# Patient Record
Sex: Male | Born: 1965 | Race: White | Hispanic: No
Health system: Western US, Academic
[De-identification: ages and names within clinical notes are randomized; demographics above are authoritative.]

---

## 2012-04-27 DIAGNOSIS — E039 Hypothyroidism, unspecified: Secondary | ICD-10-CM | POA: Insufficient documentation

## 2012-04-27 DIAGNOSIS — Z89519 Acquired absence of unspecified leg below knee: Secondary | ICD-10-CM | POA: Insufficient documentation

## 2012-07-08 DIAGNOSIS — R238 Other skin changes: Secondary | ICD-10-CM | POA: Insufficient documentation

## 2012-07-08 HISTORY — DX: Other skin changes: R23.8

## 2013-10-23 DIAGNOSIS — F1910 Other psychoactive substance abuse, uncomplicated: Secondary | ICD-10-CM | POA: Insufficient documentation

## 2013-10-23 DIAGNOSIS — Z91199 Patient's noncompliance with other medical treatment and regimen due to unspecified reason: Secondary | ICD-10-CM

## 2013-10-23 DIAGNOSIS — Z9119 Patient's noncompliance with other medical treatment and regimen: Secondary | ICD-10-CM

## 2013-10-23 HISTORY — DX: Other psychoactive substance abuse, uncomplicated: F19.10

## 2013-10-23 HISTORY — DX: Patient's noncompliance with other medical treatment and regimen due to unspecified reason: Z91.199

## 2013-10-23 HISTORY — DX: Patient's noncompliance with other medical treatment and regimen: Z91.19

## 2013-12-12 DIAGNOSIS — F29 Unspecified psychosis not due to a substance or known physiological condition: Secondary | ICD-10-CM | POA: Insufficient documentation

## 2013-12-12 HISTORY — DX: Unspecified psychosis not due to a substance or known physiological condition: F29

## 2017-08-08 DIAGNOSIS — M009 Pyogenic arthritis, unspecified: Secondary | ICD-10-CM | POA: Insufficient documentation

## 2018-11-25 DIAGNOSIS — M7989 Other specified soft tissue disorders: Secondary | ICD-10-CM | POA: Insufficient documentation

## 2021-02-23 ENCOUNTER — Other Ambulatory Visit: Payer: Self-pay

## 2021-02-23 ENCOUNTER — Emergency Department
Admission: EM | Admit: 2021-02-23 | Discharge: 2021-02-23 | Disposition: A | Discharge status: Home / Self Care | Payer: Medicare Other | Attending: Emergency Medicine | Admitting: Emergency Medicine

## 2021-02-23 ENCOUNTER — Encounter (HOSPITAL_COMMUNITY): Payer: Self-pay | Admitting: Unknown Physician Specialty

## 2021-02-23 ENCOUNTER — Emergency Department (EMERGENCY_DEPARTMENT_HOSPITAL): Payer: Medicare Other

## 2021-02-23 DIAGNOSIS — Z4789 Encounter for other orthopedic aftercare: Secondary | ICD-10-CM

## 2021-02-23 DIAGNOSIS — Z79899 Other long term (current) drug therapy: Secondary | ICD-10-CM | POA: Insufficient documentation

## 2021-02-23 DIAGNOSIS — W010XXA Fall on same level from slipping, tripping and stumbling without subsequent striking against object, initial encounter: Secondary | ICD-10-CM | POA: Insufficient documentation

## 2021-02-23 DIAGNOSIS — Y939 Activity, unspecified: Secondary | ICD-10-CM | POA: Insufficient documentation

## 2021-02-23 DIAGNOSIS — Z720 Tobacco use: Secondary | ICD-10-CM | POA: Insufficient documentation

## 2021-02-23 DIAGNOSIS — W1830XA Fall on same level, unspecified, initial encounter: Secondary | ICD-10-CM

## 2021-02-23 DIAGNOSIS — Z89512 Acquired absence of left leg below knee: Secondary | ICD-10-CM | POA: Insufficient documentation

## 2021-02-23 DIAGNOSIS — E039 Hypothyroidism, unspecified: Secondary | ICD-10-CM | POA: Insufficient documentation

## 2021-02-23 DIAGNOSIS — S8992XA Unspecified injury of left lower leg, initial encounter: Secondary | ICD-10-CM

## 2021-02-23 LAB — TRAUMA PANEL
Alcohol (Ethyl): NEGATIVE mg/dL
Anion Gap: 10 (ref 4–12)
Calcium: 9.6 mg/dL (ref 8.9–10.2)
Carbon Dioxide, Total: 26 meq/L (ref 22–32)
Chloride: 102 meq/L (ref 98–108)
Creatinine: 1.38 mg/dL — ABNORMAL HIGH (ref 0.51–1.18)
Glucose: 86 mg/dL (ref 62–125)
Hematocrit: 47 % (ref 38.0–50.0)
Hemoglobin: 15.9 g/dL (ref 13.0–18.0)
Lipase: 18 U/L (ref <70)
MCH: 31.5 pg (ref 27.3–33.6)
MCHC: 33.8 g/dL (ref 32.2–36.5)
MCV: 94 fL (ref 81–98)
Partial Thromboplastin Time: 38 s — ABNORMAL HIGH (ref 22–35)
Platelet Count: 198 10*3/uL (ref 150–400)
Potassium: 3.8 meq/L (ref 3.6–5.2)
Prothrombin INR: 1 (ref 0.8–1.3)
Prothrombin Time Patient: 12.6 s (ref 10.7–15.6)
RBC: 5.04 10*6/uL (ref 4.40–5.60)
RDW-CV: 14 % (ref 11.6–14.4)
Sodium: 138 meq/L (ref 135–145)
Urea Nitrogen: 14 mg/dL (ref 8–21)
WBC: 7.61 10*3/uL (ref 4.3–10.0)
eGFR by CKD-EPI: 58 mL/min/{1.73_m2} — ABNORMAL LOW (ref >59)

## 2021-02-23 LAB — 1ST EXTRA ORANGE TOP

## 2021-02-23 LAB — 1ST EXTRA PEARL TOP

## 2021-02-23 MED ORDER — HYDROMORPHONE HCL 1 MG/ML IJ SOLN
0.5000 mg | INTRAMUSCULAR | Status: DC | PRN
Start: 2021-02-23 — End: 2021-02-23
  Administered 2021-02-23: 0.5 mg via INTRAVENOUS
  Filled 2021-02-23: qty 1

## 2021-02-23 MED ORDER — ONDANSETRON HCL 4 MG/2ML IJ SOLN
4.0000 mg | Freq: Once | INTRAMUSCULAR | Status: AC
Start: 2021-02-23 — End: 2021-02-23
  Administered 2021-02-23: 4 mg via INTRAVENOUS
  Filled 2021-02-23: qty 2

## 2021-02-23 MED ORDER — CYCLOBENZAPRINE HCL 10 MG OR TABS
10.0000 mg | ORAL_TABLET | Freq: Once | ORAL | Status: AC
Start: 2021-02-23 — End: 2021-02-23
  Administered 2021-02-23: 10 mg via ORAL
  Filled 2021-02-23: qty 1
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
## 2021-02-23 NOTE — Discharge Instructions (Addendum)
You may use tylenol and ibuprofen for pain. Please follow up in the [facility] Aftercare clinic on 4/20 @ 230pm. This visit is for a ED follow up where you may discuss your fall and knee pain. If your pain is not getting better you may need to consider ligamentous injury which would require additional imaging which is performed outpatient. They will also help you establish with a PCP.
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
## 2021-02-23 NOTE — ED Notes (Signed)
Pt BIB EMS. Pt presents with fall on his left leg (previous/old amputation). Pt states he fell on his left amp. leg about couple hours ago prior making it to the ED. Pt c/o pain and it radiates superiorly. Pt c/o numbness on LLE (leg that pt fell) which is new. Pt denies hitting his head, denies LOC.     Lorelle Formosa, RN  02/23/21 [phone]
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
## 2021-03-03 ENCOUNTER — Other Ambulatory Visit (HOSPITAL_BASED_OUTPATIENT_CLINIC_OR_DEPARTMENT_OTHER): Payer: Self-pay

## 2021-03-03 ENCOUNTER — Other Ambulatory Visit: Payer: Self-pay

## 2021-03-03 ENCOUNTER — Emergency Department
Admission: EM | Admit: 2021-03-03 | Discharge: 2021-03-03 | Disposition: A | Discharge status: Home / Self Care | Payer: Medicare Other | Attending: Physician Assistant | Admitting: Physician Assistant

## 2021-03-03 DIAGNOSIS — X58XXXA Exposure to other specified factors, initial encounter: Secondary | ICD-10-CM | POA: Insufficient documentation

## 2021-03-03 DIAGNOSIS — L03116 Cellulitis of left lower limb: Secondary | ICD-10-CM

## 2021-03-03 DIAGNOSIS — T8740 Infection of amputation stump, unspecified extremity: Secondary | ICD-10-CM | POA: Insufficient documentation

## 2021-03-03 LAB — C_REACTIVE PROTEIN: C_Reactive Protein: 0.8 mg/L (ref 0.0–10.0)

## 2021-03-03 LAB — CBC, DIFF
% Basophils: 1 %
% Eosinophils: 3 %
% Immature Granulocytes: 0 %
% Lymphocytes: 26 %
% Monocytes: 7 %
% Neutrophils: 63 %
% Nucleated RBC: 0 %
Absolute Eosinophil Count: 0.25 10*3/uL (ref 0.00–0.50)
Absolute Lymphocyte Count: 1.98 10*3/uL (ref 1.00–4.80)
Basophils: 0.05 10*3/uL (ref 0.00–0.20)
Hematocrit: 46 % (ref 38.0–50.0)
Hemoglobin: 15.5 g/dL (ref 13.0–18.0)
Immature Granulocytes: 0.02 10*3/uL (ref 0.00–0.05)
MCH: 31.4 pg (ref 27.3–33.6)
MCHC: 33.7 g/dL (ref 32.2–36.5)
MCV: 93 fL (ref 81–98)
Monocytes: 0.53 10*3/uL (ref 0.00–0.80)
Neutrophils: 4.88 10*3/uL (ref 1.80–7.00)
Nucleated RBC: 0 10*3/uL
Platelet Count: 203 10*3/uL (ref 150–400)
RBC: 4.94 10*6/uL (ref 4.40–5.60)
RDW-CV: 14.2 % (ref 11.6–14.4)
WBC: 7.71 10*3/uL (ref 4.3–10.0)

## 2021-03-03 LAB — 1ST EXTRA ORANGE TOP

## 2021-03-03 LAB — 1ST EXTRA PEARL TOP

## 2021-03-03 LAB — 1ST EXTRA BLUE TOP

## 2021-03-03 MED ORDER — CEPHALEXIN 500 MG OR CAPS
500.0000 mg | ORAL_CAPSULE | Freq: Four times a day (QID) | ORAL | 0 refills | Status: DC
Start: 2021-03-03 — End: 2021-03-08
  Filled 2021-03-03: qty 28, 7d supply, fill #0

## 2021-03-03 MED ORDER — SULFAMETHOXAZOLE-TRIMETHOPRIM 800-160 MG OR TABS
1.0000 | ORAL_TABLET | Freq: Two times a day (BID) | ORAL | 0 refills | Status: DC
Start: 2021-03-03 — End: 2021-03-08
  Filled 2021-03-03: qty 14, 7d supply, fill #0

## 2021-03-03 NOTE — ED Triage Notes (Signed)
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  Has an appt next week but is here for evaluation due to increased pain
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
## 2021-03-03 NOTE — Discharge Instructions (Addendum)
Return to the ER if your symptoms get considerably worse.

## 2021-03-08 ENCOUNTER — Other Ambulatory Visit (HOSPITAL_BASED_OUTPATIENT_CLINIC_OR_DEPARTMENT_OTHER): Payer: Self-pay

## 2021-03-08 ENCOUNTER — Ambulatory Visit (HOSPITAL_BASED_OUTPATIENT_CLINIC_OR_DEPARTMENT_OTHER): Payer: Medicare Other | Admitting: Rehabilitative and Restorative Service Providers"

## 2021-03-08 ENCOUNTER — Ambulatory Visit: Payer: Medicare Other | Attending: Family | Admitting: Family

## 2021-03-08 ENCOUNTER — Encounter (HOSPITAL_BASED_OUTPATIENT_CLINIC_OR_DEPARTMENT_OTHER): Payer: Self-pay

## 2021-03-08 VITALS — BP 100/73 | HR 74 | Temp 98.1°F | Resp 16 | Ht 74.5 in | Wt 194.6 lb

## 2021-03-08 DIAGNOSIS — Z89619 Acquired absence of unspecified leg above knee: Secondary | ICD-10-CM | POA: Insufficient documentation

## 2021-03-08 DIAGNOSIS — F17200 Nicotine dependence, unspecified, uncomplicated: Secondary | ICD-10-CM | POA: Insufficient documentation

## 2021-03-08 DIAGNOSIS — L03116 Cellulitis of left lower limb: Secondary | ICD-10-CM | POA: Insufficient documentation

## 2021-03-08 DIAGNOSIS — S91311A Laceration without foreign body, right foot, initial encounter: Secondary | ICD-10-CM

## 2021-03-08 DIAGNOSIS — E038 Other specified hypothyroidism: Secondary | ICD-10-CM | POA: Insufficient documentation

## 2021-03-08 DIAGNOSIS — R7989 Other specified abnormal findings of blood chemistry: Secondary | ICD-10-CM | POA: Insufficient documentation

## 2021-03-08 HISTORY — DX: Laceration without foreign body, right foot, initial encounter: S91.311A

## 2021-03-08 MED ORDER — CEPHALEXIN 500 MG OR CAPS
500.0000 mg | ORAL_CAPSULE | Freq: Four times a day (QID) | ORAL | 0 refills | Status: AC
Start: 2021-03-08 — End: 2021-03-11
  Filled 2021-03-08: qty 12, 3d supply, fill #0

## 2021-03-08 MED ORDER — SULFAMETHOXAZOLE-TRIMETHOPRIM 800-160 MG OR TABS
1.0000 | ORAL_TABLET | Freq: Two times a day (BID) | ORAL | 0 refills | Status: AC
Start: 2021-03-08 — End: 2021-03-11
  Filled 2021-03-08: qty 6, 3d supply, fill #0

## 2021-03-08 MED ORDER — LEVOTHYROXINE SODIUM 125 MCG OR TABS
125.0000 ug | ORAL_TABLET | Freq: Every day | ORAL | 1 refills | Status: AC
Start: 2021-03-08 — End: ?
  Filled 2021-03-08: qty 90, 90d supply, fill #0

## 2021-03-08 MED ORDER — NICOTINE POLACRILEX 4 MG MT GUM
4.0000 mg | CHEWING_GUM | OROMUCOSAL | 1 refills | Status: AC | PRN
Start: 2021-03-08 — End: ?
  Filled 2021-03-08: qty 110, 10d supply, fill #0
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
## 2021-03-08 NOTE — Patient Instructions (Addendum)
CONTINUE ANTIBIOTICS FOR A TOTAL OF 10 DAYS.    CHECK LABS TODAY.    KEEP AREA COVERED TO CAPTURE DRAINAGE.    FOLLOW UP WITH PROTHETICS TO LOOK INTO GETTING DEVICE ADJUSTED OR REPAIRED.    ESTABLISH WITH REHAB MEDICINE SPECIALIST TO FOLLOW ISSUES WITH SKIN AND MASS IN LEG.    ESTABLISH WITH PRIMARY MEDICAL CARE AT [facility]    SEE [place] IN [place] AFTERCARE CLINIC IN 3 WEEKS FOR LEG CHECK (IF NOT ALREADY SEEN BY OTHER SPECIALISTS)    SEE [place] IN [place] AFTERCARE CLINIC IN 6 WEEKS FOR THYROID CHECK.

## 2021-03-09 NOTE — Progress Notes (Signed)
Spoke to patient and nurse from the After Care Clinic regarding prosthetic care as [place].  Briefly patient needs adjustment to his current prosthesis made out of state.  The Aftercare Clinic has entered the appropriate Epic referrals to Rehab Medicine as well as to our Prosthetics & Orthotics Clinic.  Patient will reach out to the Amputee Clinic for an appointment as soon as possible to manage his on going prosthetic care in [place].  Patient will also reach out to our outpatient Prosthetic & Orthotics Clinic for a brief consult/evaluation appointment and assess what he needs.  Patient in agreement with this plan and appreciative of this phone conversation while at his Aftercare visit.    Plan for Prosthetics & Orthotics to follow and make sure he has made the appropriate Clinic appointments.

## 2021-03-17 ENCOUNTER — Telehealth (HOSPITAL_BASED_OUTPATIENT_CLINIC_OR_DEPARTMENT_OTHER): Payer: Self-pay

## 2021-03-17 ENCOUNTER — Ambulatory Visit: Payer: Medicare Other | Attending: Family

## 2021-03-17 NOTE — Telephone Encounter (Signed)
RETURN CALL: Voicemail - Detailed Message      SUBJECT:  General Message     MESSAGE: Medical question regarding degrading prosthetic sleeves. States Christopher Reynolds was hoping to get patient in May. Please call patient to discuss.

## 2021-03-20 NOTE — Telephone Encounter (Signed)
Tried calling patient [phone]) [phone] attempts, busy/offline tone. Will try again later this afternoon.

## 2021-03-30 ENCOUNTER — Other Ambulatory Visit (HOSPITAL_BASED_OUTPATIENT_CLINIC_OR_DEPARTMENT_OTHER): Payer: Self-pay

## 2021-03-30 ENCOUNTER — Ambulatory Visit: Payer: Medicare Other | Attending: Internal Medicine | Admitting: Internal Medicine

## 2021-03-30 VITALS — BP 105/67 | HR 84 | Temp 97.9°F | Resp 16 | Wt 203.0 lb

## 2021-03-30 DIAGNOSIS — Z89512 Acquired absence of left leg below knee: Secondary | ICD-10-CM | POA: Insufficient documentation

## 2021-03-30 DIAGNOSIS — T8789 Other complications of amputation stump: Secondary | ICD-10-CM

## 2021-03-30 MED ORDER — ADJUSTABLE ALUMINUM CANE MISC
Freq: Every day | 0 refills | Status: AC
Start: 2021-03-30 — End: ?
  Filled 2021-03-30: qty 1, fill #0

## 2021-03-30 NOTE — Patient Instructions (Addendum)
It was nice to meet you today.    -- It looks like the infection cleared up-- keep using the cane to help offload some pressure off your leg.    -- Follow up with your new primary care provider next week as scheduled.    -- I'll put in a referral to the PT clinic (through Rehab Med) to ask them about the wheelchair.
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
## 2021-04-07 ENCOUNTER — Encounter (HOSPITAL_BASED_OUTPATIENT_CLINIC_OR_DEPARTMENT_OTHER): Payer: Self-pay

## 2021-04-07 ENCOUNTER — Ambulatory Visit (HOSPITAL_BASED_OUTPATIENT_CLINIC_OR_DEPARTMENT_OTHER): Payer: Medicare Other | Admitting: Unknown Physician Specialty

## 2021-04-07 NOTE — Progress Notes (Signed)
Christopher Reynolds did not cancel and was not present for a scheduled appointment today.  Disposition: New patient to clinic planned to establish care today. Chart reviewed and Mr. Ray may reschedule his establish care visit at his earliest convenience.

## 2021-04-19 ENCOUNTER — Telehealth (HOSPITAL_BASED_OUTPATIENT_CLINIC_OR_DEPARTMENT_OTHER): Payer: Self-pay

## 2021-04-19 NOTE — Telephone Encounter (Addendum)
LVM for patient. Changed appt from 6/30 to New; , in-person or can be converted to telemed on 6/16. Please connect to clinic if unable to appoint.

## 2021-05-04 ENCOUNTER — Ambulatory Visit (HOSPITAL_BASED_OUTPATIENT_CLINIC_OR_DEPARTMENT_OTHER): Payer: Medicare Other | Admitting: Physical Medicine & Rehabilitation

## 2021-05-04 ENCOUNTER — Telehealth (HOSPITAL_BASED_OUTPATIENT_CLINIC_OR_DEPARTMENT_OTHER): Payer: Self-pay

## 2021-05-04 NOTE — Progress Notes (Unsigned)
[facility]      ID: Christopher Reynolds is a 55 year old year old male who presents for evaluation of L transtibial amputation.       CC: No chief complaint on file.      Amputation History:  Side/Level:  L TTA  Date:  ***  Cause:  [facility]  Surgeon/Facility:  ***  Current Prosthetist:  ***  Current Prosthesis: total contact socket, gel liner and locking pin, exoskeleton pylon, dynamic response foot.  ***   Prior Function: Unlimited community ambulation, no assistive device.  ***  Current Functional Level: K  ***  Expected Functional Level (with optimal prosthetic): K  ***  Assistive devices:  ***    HPI: ***      ROS: See HPI.  All other systems were reviewed with the patient and were negative today.     Allergies and Medications: Reviewed with the patient as documented and updated as appropriate in EPIC.     Past Medical History:   Patient Active Problem List   Diagnosis   . Chronic infection of left knee (HCC)   . Hypothyroid   . Mass of soft tissue of knee   . Status post below-knee amputation [facility])      Social History:   Social History     Tobacco Use   . Smoking status: Current Every Day Smoker     Packs/day: 0.50     Types: Cigarettes   . Smokeless tobacco: Never Used   Substance Use Topics   . Alcohol use: Not Currently   . Drug use: Not Currently      Family History:        Documented in EPIC.  Reviewed and updated as appropriate.     Physical Exam:  VS:  There were no vitals taken for this visit. (no height taken for this visit) (no weight taken for this visit) There is no height or weight on file to calculate BMI. Pain: {PAIN WUGQB:169450}  Exercise Vitals:    ***    Results:  Xray 02/23/21    IMPRESSION  No acute fractures. Patient is status post of the knee amputation. Partially visualized intramedullary nail with distal interlocking screws in the femur. Visualized hardware appears intact. No knee joint effusion. Surgical staplers overlying the tibial stump. No acute soft tissue swelling.    Assessment:  Christopher Reynolds is a 55 year old year old male who presents for initial evaluation for L transtibial amputation rehabilitation.      Plan:  Amputation Rehab:  The patient has the potential to be a K*** level ambulator with a well fitting and appropriate prosthesis.      Other: We have counseled the patient about the available services for amputees at [facility] including the amputee support group, rehab psychology services and vocational services.      Falls: We have discussed the high risk of falls after amputation and fall prevention strategies.     Pain:  We discussed pain management strategies after amputation.     We have answered all of the patients questions and the patient agrees to the plan as described above.  We have provided the patient with information on how to contact the clinic for follow up appointments and for problems that arise prior to their next scheduled visit.     F/U  ***.    I spent a total of *** minutes on the day of the visit including pre and post visit  charting, coordination of care and the visit itself.

## 2021-05-04 NOTE — Telephone Encounter (Signed)
LVM to r/s. 2nd no show.

## 2021-05-18 ENCOUNTER — Encounter (HOSPITAL_BASED_OUTPATIENT_CLINIC_OR_DEPARTMENT_OTHER): Payer: Medicare Other | Admitting: Physical Medicine & Rehabilitation

## 2021-06-12 NOTE — Progress Notes (Signed)
PROSTHETICS & ORTHOTICS - Left BKA Repair    Diagnosis: Left BKA  Rx: Left minor repair--replace pins, thread worn down  Current device: Left BK prosthesis  Referring Provider: None yet; will make appointment to be seen at [facility]   Interpreter: No    Patient seen for adjustment of left BK prosthesis to replace worn out pins for locking liners.    SUBJECTIVE  Christopher Reynolds is a 55 year old male who was seen at [facility] for initial visit for left BK prosthesis. Patient arrived to the appointment unaccompanied, utilizing no assistive device.   Pain: Covered, open sore on left medial proximal posterior leg, near edge of socket. Wearing 2x4 gauze pad   Recent Fall History: None reported    OBJECTIVE  Height: 6'2"  Weight: 203 lbs  No socks worn with prosthesis today.    Skin Inspection: covered open wound on left BKA as noted above; reports sweating in liners and is inquiring today about getting replacement locking gel liners that would reduce sweating inside the prosthesis. Potential liner option: [employer] SmartTemp locking gel liners with Ortholast, size M+     Patient reported goals: reduce wounds, reduce sweating in liner, increase activity level once wound resolves  Patient states he would like to get a new prosthesis and new liners, hopefully the kind that will reduce sweating while wearing the liners    ASSESSMENT/ACTION  Christopher Reynolds is a 55 year old male who moved here from [place] via [place] early in the pandemic and comes into [facility] for treatment of improving ambulation endurance, reducing pain and wound prevention associated with his well-worn left BK prosthesis. He is asking about getting new pins as his current pins are worn down. He would also like replacement liners as his are worn out, torn, and not able to be sufficiently cleaned. He reports sweating in his liners and asked about SmartTemp replacement liners to help control sweating. He would also like to get a new  prosthesis saying this one is 54 years old and he struggles with "bellringing" inside the socket which he feels limits his control of the prosthesis. He reports he is eligible for a new foot and new socket.     New pins were provided and worked appropriately. They were LockTited into his existing liners, were inspected for fit and quality and found to be appropriate for delivery at this time. Today, I re-educated patient on wear and care of device and addressed relevant questions and concerns. Patient verbalized understanding and demonstrated independence and competencies in donning/doffing and use of device.    Christopher Reynolds's current prosthesis continues to be appropriate but is nearing the end of its expected useful life and is worn and in need of replacement.     PLAN: Christopher Reynolds will make an appointment to be seen in [facility] requesting referral for replacement consumables (liners) and potentially replacement prosthesis.        Collene Mares Description   [phone]  LT 2 Prosthetics: Repair or replace minor parts: New pins for locking liners; old pin threads worn out

## 7364-02-18 DEATH — deceased
